# Patient Record
Sex: Female | Born: 2000 | Hispanic: Yes | Marital: Single | Smoking: Never smoker
Health system: Southern US, Community
[De-identification: ages and names within clinical notes are randomized; demographics above are authoritative.]

## PROBLEM LIST (undated history)

## (undated) ENCOUNTER — Emergency Department (HOSPITAL_COMMUNITY): Admission: EM | Payer: Self-pay | Source: Home / Self Care

## (undated) DIAGNOSIS — J45909 Unspecified asthma, uncomplicated: Secondary | ICD-10-CM

## (undated) DIAGNOSIS — J050 Acute obstructive laryngitis [croup]: Secondary | ICD-10-CM

## (undated) DIAGNOSIS — R109 Unspecified abdominal pain: Secondary | ICD-10-CM

## (undated) DIAGNOSIS — J0391 Acute recurrent tonsillitis, unspecified: Secondary | ICD-10-CM

## (undated) DIAGNOSIS — R519 Headache, unspecified: Secondary | ICD-10-CM

## (undated) DIAGNOSIS — R1110 Vomiting, unspecified: Secondary | ICD-10-CM

## (undated) DIAGNOSIS — N926 Irregular menstruation, unspecified: Secondary | ICD-10-CM

## (undated) DIAGNOSIS — R55 Syncope and collapse: Secondary | ICD-10-CM

## (undated) DIAGNOSIS — N390 Urinary tract infection, site not specified: Secondary | ICD-10-CM

## (undated) DIAGNOSIS — G43909 Migraine, unspecified, not intractable, without status migrainosus: Secondary | ICD-10-CM

## (undated) HISTORY — DX: Acute obstructive laryngitis (croup): J05.0

## (undated) HISTORY — DX: Irregular menstruation, unspecified: N92.6

## (undated) HISTORY — DX: Urinary tract infection, site not specified: N39.0

## (undated) HISTORY — DX: Vomiting, unspecified: R11.10

## (undated) HISTORY — DX: Syncope and collapse: R55

## (undated) HISTORY — DX: Migraine, unspecified, not intractable, without status migrainosus: G43.909

## (undated) HISTORY — DX: Unspecified abdominal pain: R10.9

## (undated) HISTORY — DX: Headache, unspecified: R51.9

## (undated) HISTORY — DX: Acute recurrent tonsillitis, unspecified: J03.91

---
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
## 2021-07-08 ENCOUNTER — Other Ambulatory Visit: Payer: Self-pay

## 2021-07-08 ENCOUNTER — Emergency Department (HOSPITAL_COMMUNITY)
Admission: EM | Admit: 2021-07-08 | Discharge: 2021-07-08 | Disposition: A | Discharge status: Home / Self Care | Payer: No Typology Code available for payment source | Attending: Emergency Medicine | Admitting: Emergency Medicine

## 2021-07-08 ENCOUNTER — Emergency Department (HOSPITAL_COMMUNITY): Payer: No Typology Code available for payment source

## 2021-07-08 DIAGNOSIS — S6992XA Unspecified injury of left wrist, hand and finger(s), initial encounter: Secondary | ICD-10-CM | POA: Diagnosis present

## 2021-07-08 DIAGNOSIS — W260XXA Contact with knife, initial encounter: Secondary | ICD-10-CM | POA: Insufficient documentation

## 2021-07-08 DIAGNOSIS — S61012A Laceration without foreign body of left thumb without damage to nail, initial encounter: Secondary | ICD-10-CM | POA: Diagnosis not present

## 2021-07-08 MED ORDER — LIDOCAINE HCL 2 % IJ SOLN
10.0000 mL | Freq: Once | INTRAMUSCULAR | Status: DC
  Filled 2021-07-08: qty 20

## 2021-07-08 NOTE — ED Triage Notes (Signed)
Pt w 1in lac to L thumb, oozing blood in triage. Last tdap approx 31yrs ago.

## 2021-07-08 NOTE — ED Provider Triage Note (Signed)
Emergency Medicine Provider Triage Evaluation Note  AICHA CLINGENPEEL , a 21 y.o. female  was evaluated in triage.  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  Last tetanus was 3 years ago.  Review of Systems  Positive:  Negative: See above   Physical Exam  BP 120/74    Pulse 79    Temp 98.6 F (37 C) (Oral)    Resp 16    SpO2 100%  Gen:   Awake, no distress   Resp:  Normal effort  MSK:   Moves extremities without difficulty  Other:    Medical Decision Making  Medically screening exam initiated at 8:51 PM.  Appropriate orders placed.  NYARA CAPELL was informed that the remainder of the evaluation will be completed by another provider, this initial triage assessment does not replace that evaluation, and the importance of remaining in the ED until their evaluation is complete.     Honor Loh [place], [place] 07/08/21 2054

## 2021-07-08 NOTE — ED Provider Notes (Signed)
[facility] EMERGENCY DEPARTMENT Provider Note   CSN: [id number] Arrival date & time: 07/08/21  2019     History  Chief Complaint  Patient presents with   Laceration    Victoria Oneal is a 21 y.o. female here presenting with left thumb laceration.  Patient states that she was opening a bag with a knife and accidentally cut the left thumb.  She states that it was bleeding so she wrapped it up with some gauze and came here.  Her tetanus is up-to-date.   The history is provided by the patient.      Home Medications Prior to Admission medications   Medication Sig Start Date End Date Taking? Authorizing Provider  ondansetron (ZOFRAN ODT) 4 MG disintegrating tablet Take 1 tablet (4 mg total) by mouth every 8 (eight) hours as needed. Patient not taking: No sig reported 12/29/18   Jacalyn Lefevre, MD      Allergies    Patient has no known allergies.    Review of Systems   Review of Systems  Skin:  Positive for wound.  All other systems reviewed and are negative.  Physical Exam Updated Vital Signs BP 120/74    Pulse 79    Temp 98.6 F (37 C) (Oral)    Resp 16    SpO2 100%  Physical Exam Vitals and nursing note reviewed.  Constitutional:      Appearance: Normal appearance.  HENT:     Head: Normocephalic.     Mouth/Throat:     Mouth: Mucous membranes are moist.  Eyes:     Pupils: Pupils are equal, round, and reactive to light.  Cardiovascular:     Rate and Rhythm: Normal rate.     Pulses: Normal pulses.  Pulmonary:     Effort: Pulmonary effort is normal.  Abdominal:     General: Abdomen is flat.  Musculoskeletal:     Cervical back: Normal range of motion.     Comments: Left thumb with 3 cm laceration on the volar aspect.  It approaches the nail but does not involve the nail.  Patient is able to flex the IP joint, there is no obvious tendon exposed.  Normal capillary refill  Neurological:     General: No focal deficit present.     Mental Status:  She is alert and oriented to person, place, and time.  Psychiatric:        Mood and Affect: Mood normal.        Behavior: Behavior normal.    ED Results / Procedures / Treatments   Labs (all labs ordered are listed, but only abnormal results are displayed) Labs Reviewed - No data to display  EKG None  Radiology DG Finger Thumb Left  Result Date: 07/08/2021 CLINICAL DATA:  Finger laceration EXAM: LEFT THUMB 2+V COMPARISON:  None. FINDINGS: No fracture or dislocation is seen. The joint spaces are preserved. Mild soft tissue swelling along the volar aspect of the 1st digit. No radiopaque foreign body is seen. IMPRESSION: Soft tissue swelling along the 1st digit. No fracture, dislocation, or radiopaque foreign body is seen. Electronically Signed   By: Charline Bills M.D.   On: 07/08/2021 21:13    Procedures .Marland KitchenLaceration Repair  Date/Time: 07/08/2021 9:29 PM Performed by: Charlynne Pander, MD Authorized by: Charlynne Pander, MD   Consent:    Consent obtained:  Verbal   Consent given by:  Patient   Risks discussed:  Infection, pain, retained foreign body, poor cosmetic result  and poor wound healing Anesthesia:    Anesthesia method:  Local infiltration   Local anesthetic:  Lidocaine 2% w/o epi Exploration:    Hemostasis achieved with:  Direct pressure   Contaminated: no   Treatment:    Area cleansed with:  Saline   Amount of cleaning:  Extensive   Irrigation solution:  Sterile saline   Visualized foreign bodies/material removed: no   Skin repair:    Repair method:  Sutures   Suture size:  5-0   Suture material:  Prolene   Suture technique:  Simple interrupted   Number of sutures:  3 Approximation:    Approximation:  Close Repair type:    Repair type:  Simple Post-procedure details:    Dressing:  Sterile dressing   Procedure completion:  Tolerated well, no immediate complications    Medications Ordered in ED Medications  lidocaine (XYLOCAINE) 2 % (with pres)  injection 200 mg (has no administration in time range)    ED Course/ Medical Decision Making/ A&P  MDM Number of Diagnoses or Management Options Diagnosis management comments: Victoria Oneal is a 21 y.o. female here presenting with left thumb laceration.  Patient's tetanus is up-to-date.  Will get x-ray to rule out fracture.  Will suture laceration  9:30 PM I reviewed x-ray and there was no fracture or dislocation.  I placed 3 sutures.  Suture removal in a week.  Stable for discharge      Amount and/or Complexity of Data Reviewed Tests in the radiology section of CPT: ordered and reviewed Independent visualization of images, tracings, or specimens: yes                            Final Clinical Impression(s) / ED Diagnoses Final diagnoses:  None    Rx / DC Orders ED Discharge Orders     None         Charlynne Pander, MD 07/08/21 2130

## 2021-07-08 NOTE — Discharge Instructions (Signed)
You had 3 sutures placed on the left thumb   Keep wound clean and dry. You may replace the bandage with bandaid tomorrow   Suture removal in a week   Return to ER if you have uncontrolled bleeding, severe pain, purulent discharge

## 2021-07-27 ENCOUNTER — Encounter (HOSPITAL_COMMUNITY): Payer: Self-pay | Admitting: Emergency Medicine

## 2021-07-27 ENCOUNTER — Emergency Department (HOSPITAL_COMMUNITY)
Admission: EM | Admit: 2021-07-27 | Discharge: 2021-07-28 | Disposition: A | Discharge status: Home / Self Care | Payer: Medicaid Other | Attending: Emergency Medicine | Admitting: Emergency Medicine

## 2021-07-27 ENCOUNTER — Other Ambulatory Visit: Payer: Self-pay

## 2021-07-27 DIAGNOSIS — Z5321 Procedure and treatment not carried out due to patient leaving prior to being seen by health care provider: Secondary | ICD-10-CM | POA: Insufficient documentation

## 2021-07-27 DIAGNOSIS — R1030 Lower abdominal pain, unspecified: Secondary | ICD-10-CM | POA: Insufficient documentation

## 2021-07-27 DIAGNOSIS — X58XXXA Exposure to other specified factors, initial encounter: Secondary | ICD-10-CM | POA: Diagnosis not present

## 2021-07-27 DIAGNOSIS — T192XXA Foreign body in vulva and vagina, initial encounter: Secondary | ICD-10-CM | POA: Diagnosis not present

## 2021-07-27 NOTE — ED Triage Notes (Signed)
Pt reported to [facility] with concerns of lost tampon and lower abominal pain. Pt states "I put a tampon in yesterday and it wasn't there when I went to take it out last night". Also reports pain to lower abdomen, denies any N/V/D.

## 2021-07-27 NOTE — ED Provider Triage Note (Signed)
Emergency Medicine Provider Triage Evaluation Note  Victoria Oneal , a 21 y.o. female  was evaluated in triage.  Pt complains of retained tampon.  States she started her menstrual cycle yesterday.  She put a tampon in.  Came home from work went to remove it and could not feel it.  States she tried to read.  However cannot feel anything.  She denies any pain, vaginal discharge.  Review of Systems  Positive: Retained foreign object Negative: Pelvic pain, vaginal discharge  Physical Exam  BP 129/80 (BP Location: Left Arm)    Pulse 77    Temp 98.6 F (37 C) (Oral)    Resp 18    SpO2 99%  Gen:   Awake, no distress   Resp:  Normal effort  MSK:   Moves extremities without difficulty  Other:    Medical Decision Making  Medically screening exam initiated at 8:09 PM.  Appropriate orders placed.  KAILIN PRINCIPATO was informed that the remainder of the evaluation will be completed by another provider, this initial triage assessment does not replace that evaluation, and the importance of remaining in the ED until their evaluation is complete.  Possible retained tampon   Erickson Yamashiro A, PA-C 07/27/21 2010

## 2021-07-28 NOTE — ED Notes (Signed)
Called for vitals x5 °

## 2022-02-04 IMAGING — CT CT CERVICAL SPINE W/O CM
3 of 4 series · 13 of 33 positions shown, 16 images · non-contrast
Comparison: None.

CLINICAL DATA: Syncopal episode. Head trauma. Loss of
consciousness.

EXAM:
CT HEAD WITHOUT CONTRAST
CT CERVICAL SPINE WITHOUT CONTRAST
TECHNIQUE: Multidetector CT imaging of the head and cervical spine was
performed following the standard protocol without intravenous
contrast. Multiplanar CT image reconstructions of the cervical spine
were also generated.

[Series 7: c_spine 2.0 cor bone · coronal · 0.24mm/px · 3 of 61 slices shown]
[im 14/61  bone]
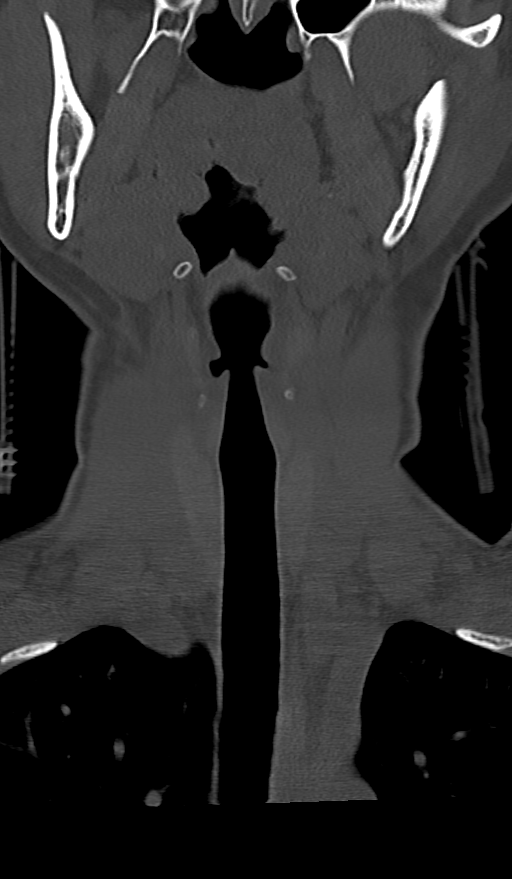
[im 25/61  bone]
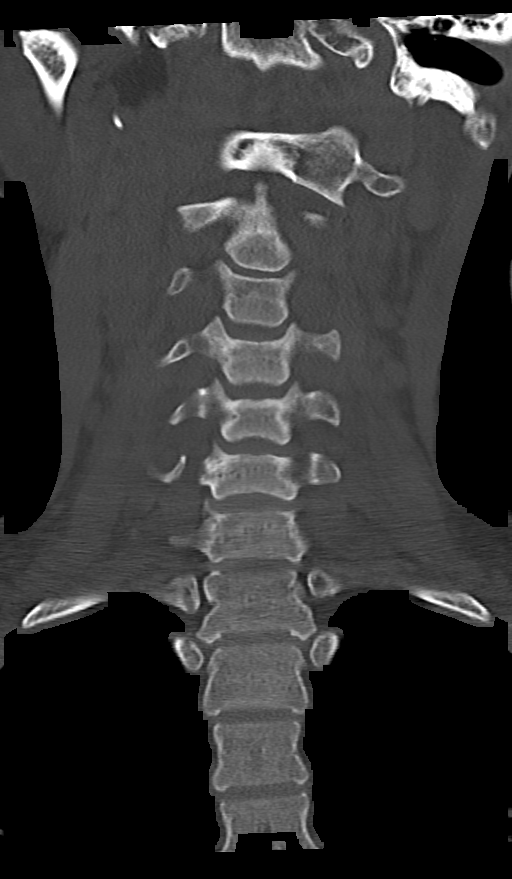
[im 36/61  bone]
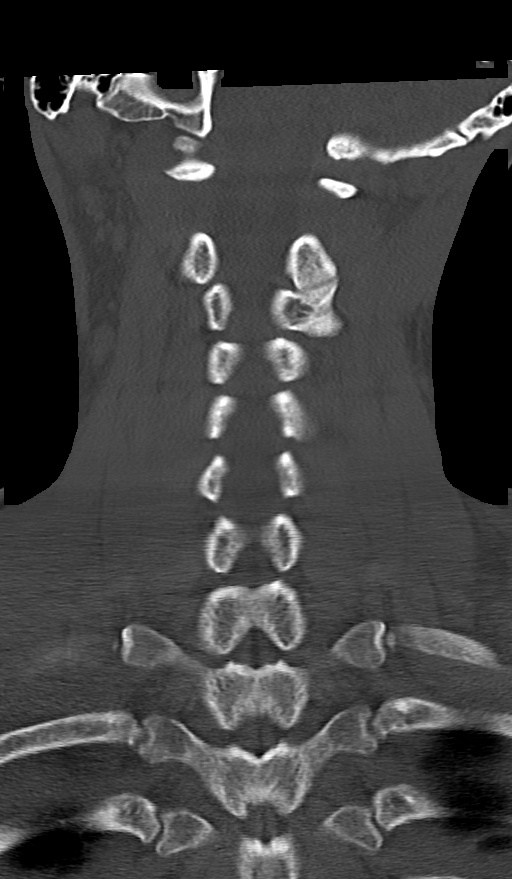

[Series 8: c_spine 2.0 sag bone · sagittal · 0.33mm/px · 5 of 61 slices shown, 6 images]
[im 21/61  bone]
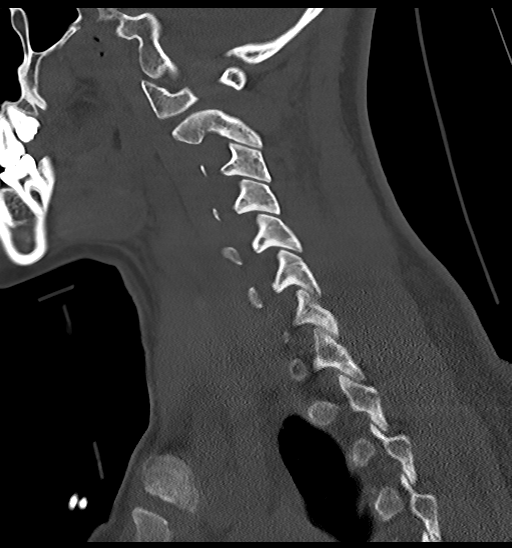
[im 26/61  bone]
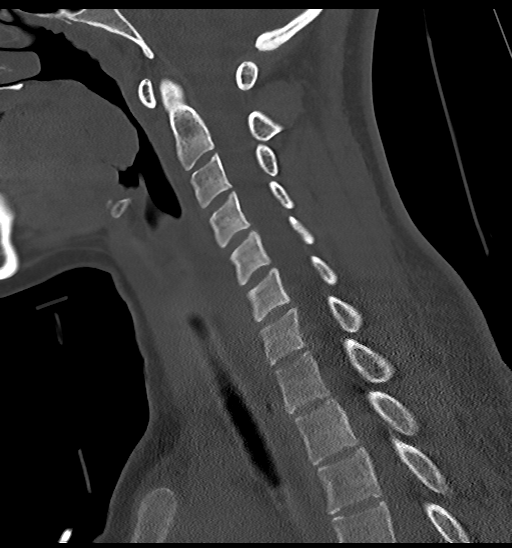
[im 31/61  soft-tissue]
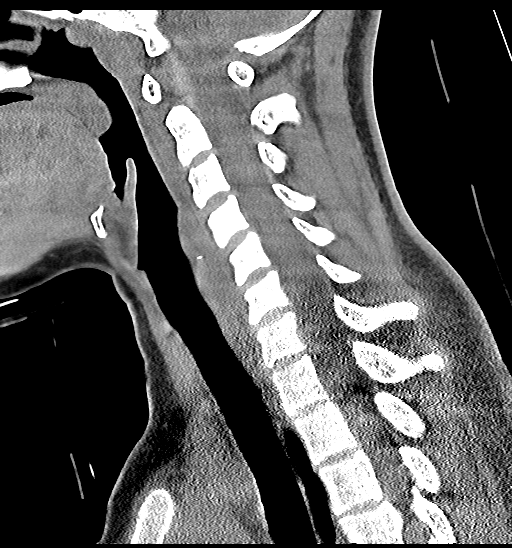
[im 31/61  bone]
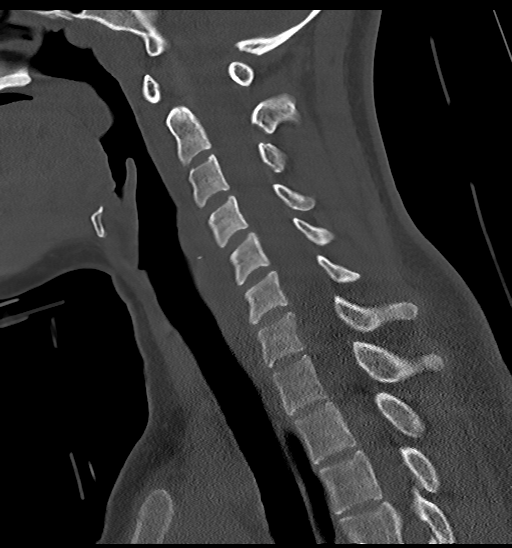
[im 36/61  bone]
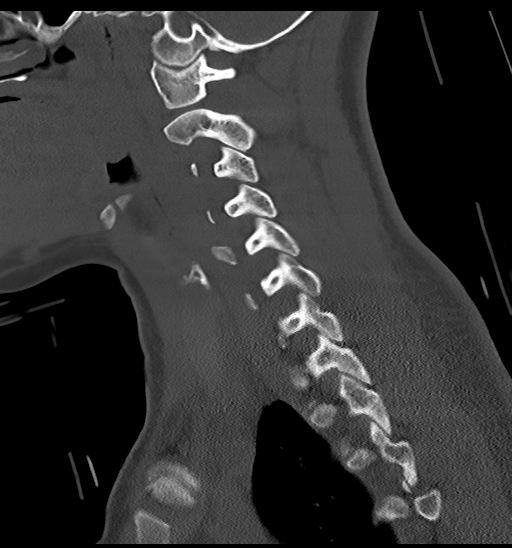
[im 41/61  bone]
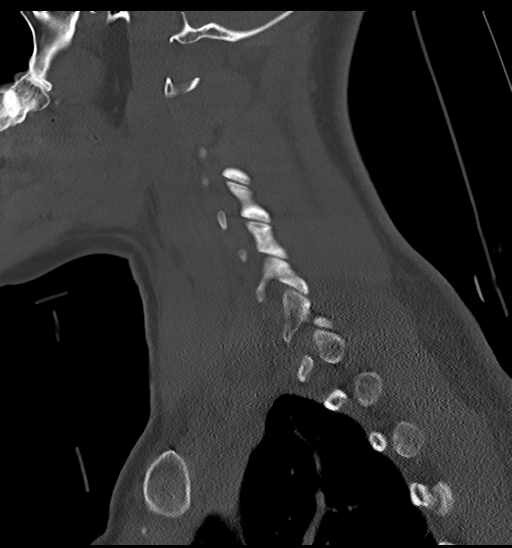

[Series 10: c_spine 2.0 orthogonals · axial · 0.21mm/px · z∈[-271,-146]mm · 5 of 98 slices shown, 7 images]
[im 17/98  soft-tissue]
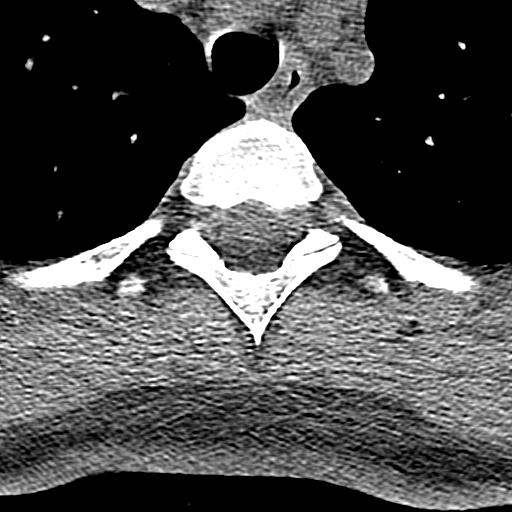
[im 17/98  bone]
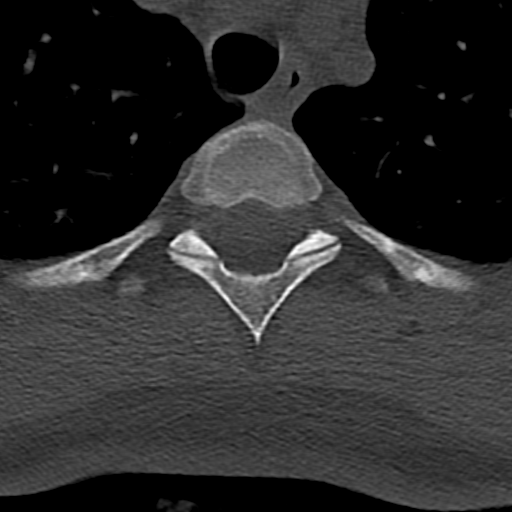
[im 33/98  bone]
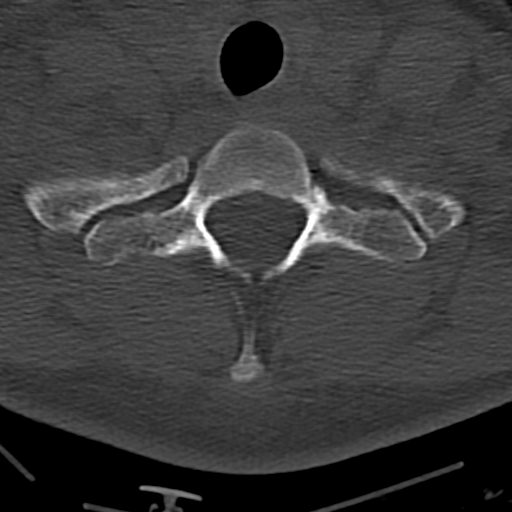
[im 49/98  bone]
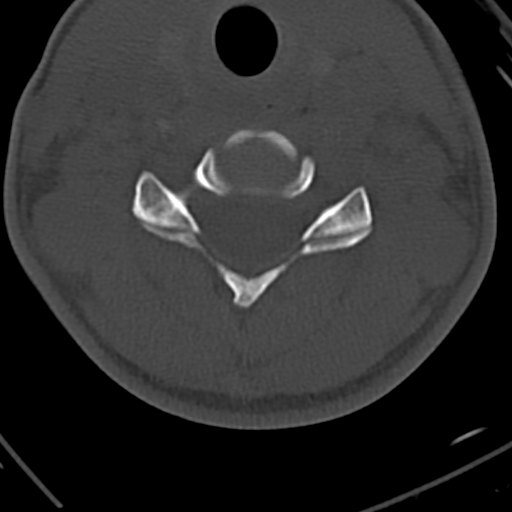
[im 65/98  bone]
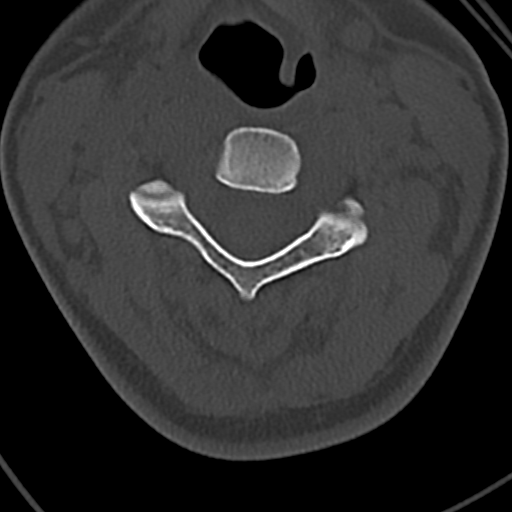
[im 81/98  soft-tissue]
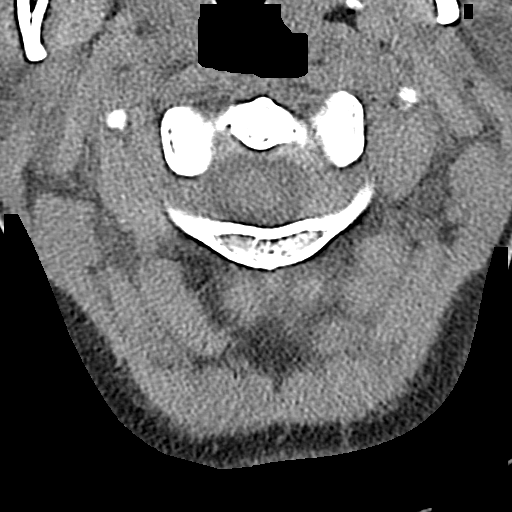
[im 81/98  bone]
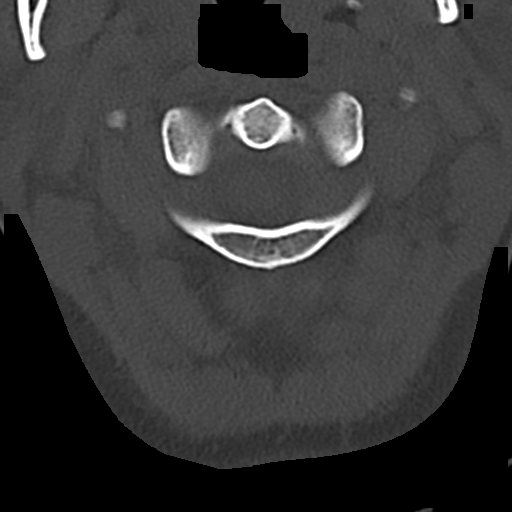

[13 of 33 positions shown; findings below may reference images not displayed]

FINDINGS: CT HEAD FINDINGS

Brain: The brain shows a normal appearance without evidence of
malformation, atrophy, old or acute small or large vessel
infarction, mass lesion, hemorrhage, hydrocephalus or extra-axial
collection.

Vascular: No hyperdense vessel. No evidence of atherosclerotic
calcification.

Skull: Normal.  No traumatic finding.  No focal bone lesion.

Sinuses/Orbits: Sinuses are clear. Orbits appear normal. Mastoids
are clear.

Other: None significant

CT CERVICAL SPINE FINDINGS

Alignment: Normal

Skull base and vertebrae: Normal

Soft tissues and spinal canal: Normal

Disc levels:  Normal

Upper chest: Normal

Other: None
IMPRESSION: HEAD CT:

Normal.

CERVICAL SPINE CT:

Normal.

## 2022-02-04 IMAGING — CT CT HEAD W/O CM
4 series · 16 of 47 positions shown, 18 images · non-contrast
Comparison: None.

CLINICAL DATA: Syncopal episode. Head trauma. Loss of
consciousness.

EXAM:
CT HEAD WITHOUT CONTRAST
CT CERVICAL SPINE WITHOUT CONTRAST
TECHNIQUE: Multidetector CT imaging of the head and cervical spine was
performed following the standard protocol without intravenous
contrast. Multiplanar CT image reconstructions of the cervical spine
were also generated.

[Series 3: head without · axial · non-contrast · 0.46mm/px · z∈[-95,+20]mm · 7 of 31 slices shown, 9 images]
[im 4/31  brain]
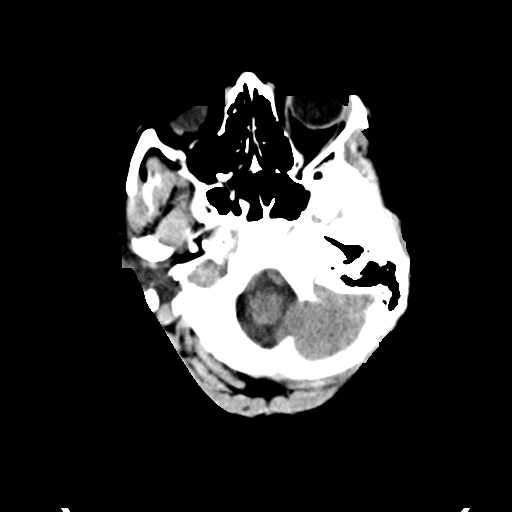
[im 4/31  bone]
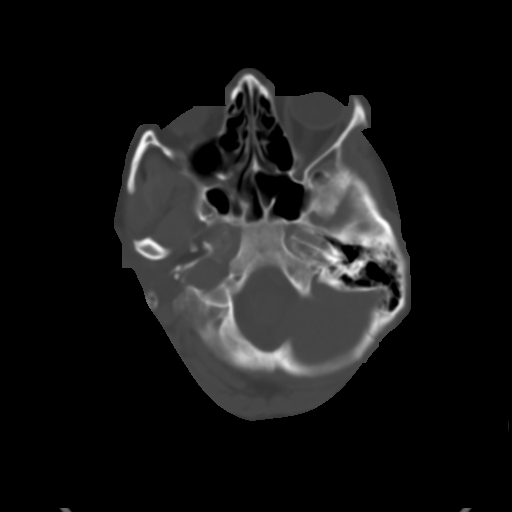
[im 8/31  brain]
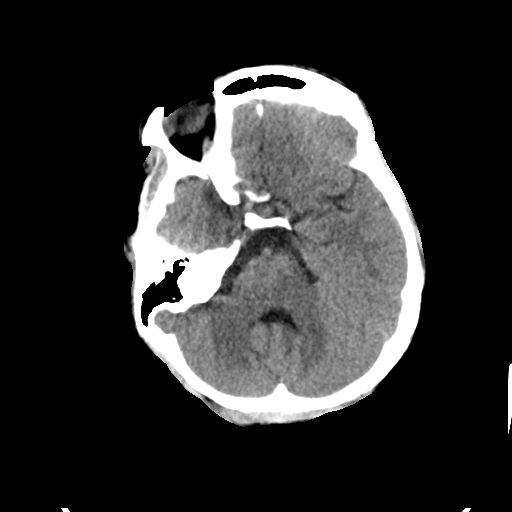
[im 12/31  brain]
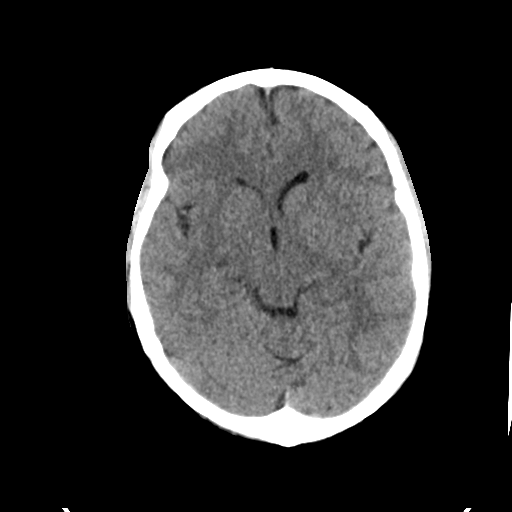
[im 16/31  brain]
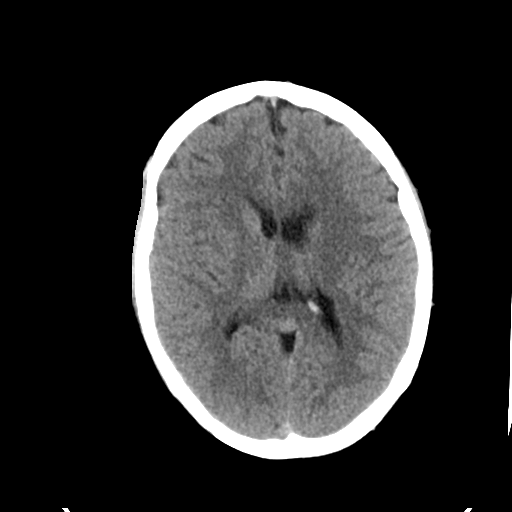
[im 19/31  brain]
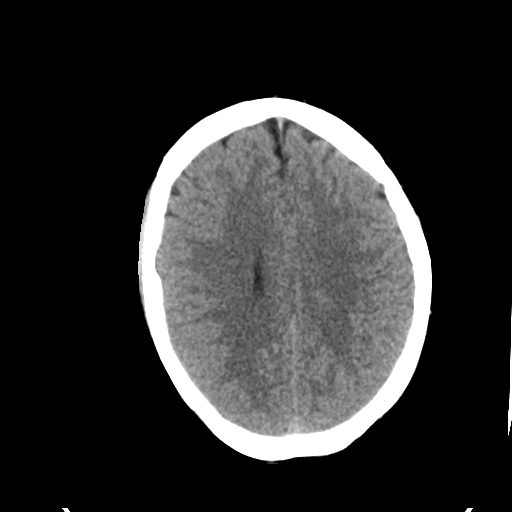
[im 19/31  bone]
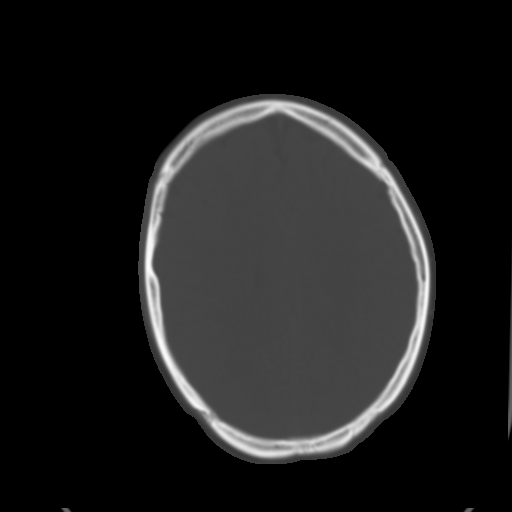
[im 23/31  brain]
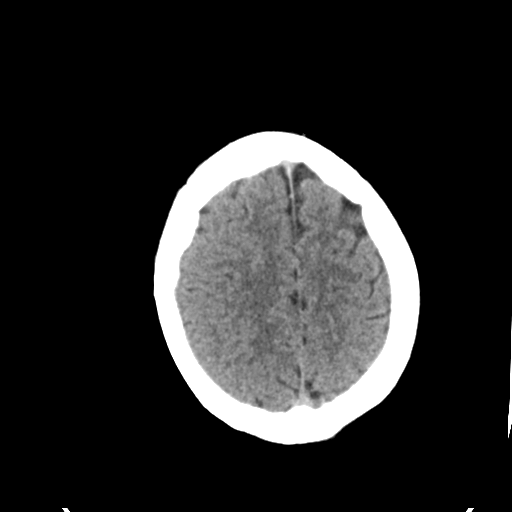
[im 27/31  brain]
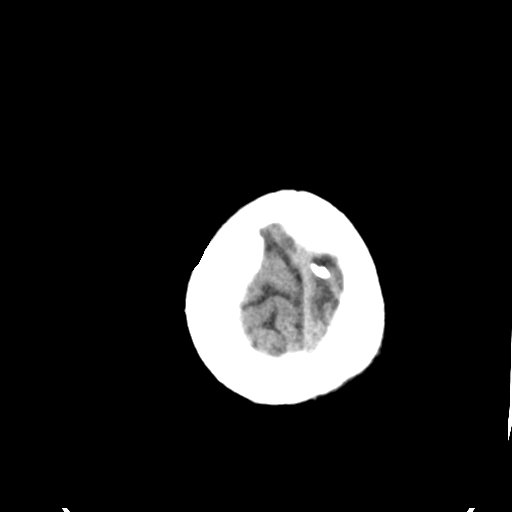

[Series 4: head bone · axial · 0.46mm/px · z∈[-96,-66]mm · 3 of 77 slices shown]
[im 8/77  bone]
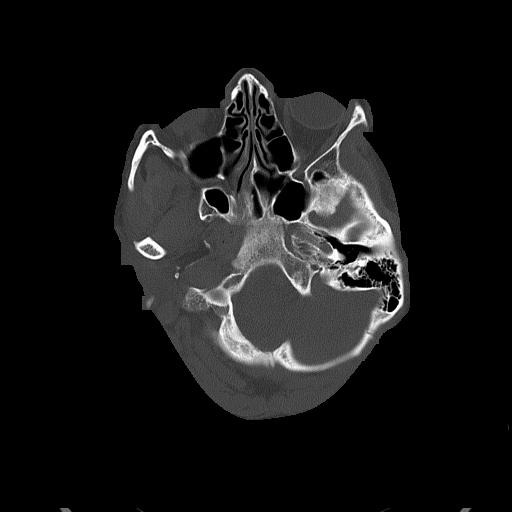
[im 16/77  bone]
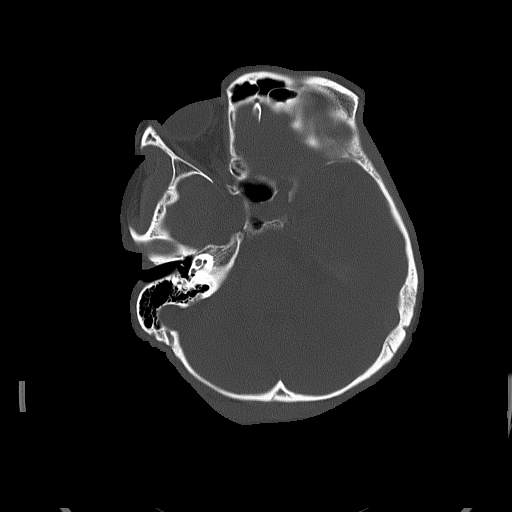
[im 23/77  bone]
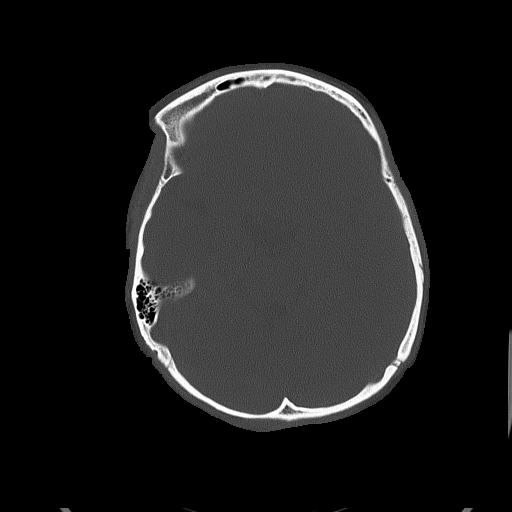

[Series 5: head without cor · coronal · non-contrast · 0.29mm/px · 3 of 71 slices shown]
[im 24/71  brain]
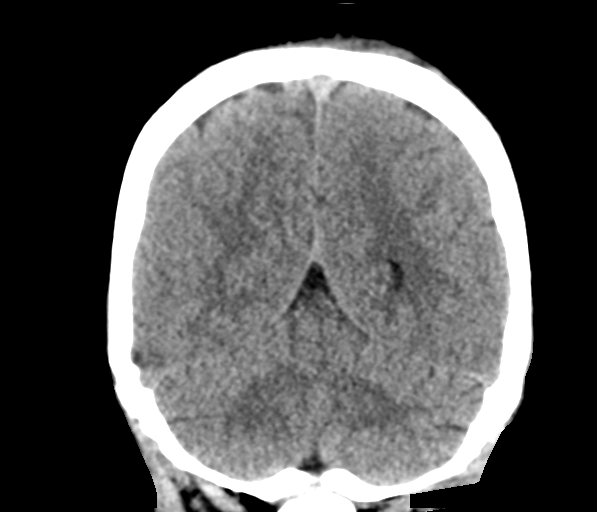
[im 32/71  brain]
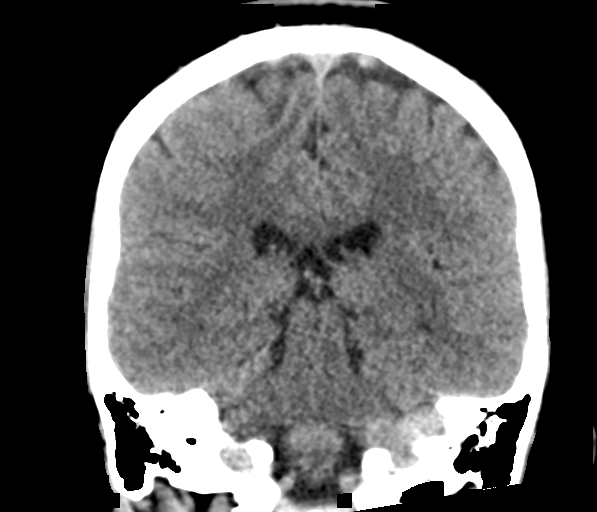
[im 39/71  brain]
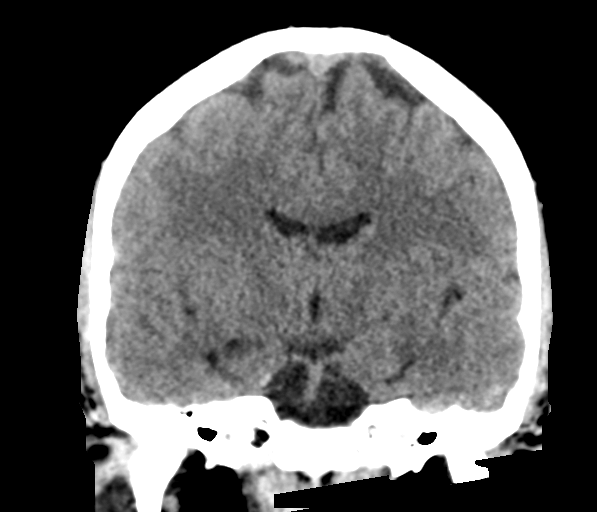

[Series 6: head without sag · sagittal · non-contrast · 0.32mm/px · 3 of 62 slices shown]
[im 23/62  brain]
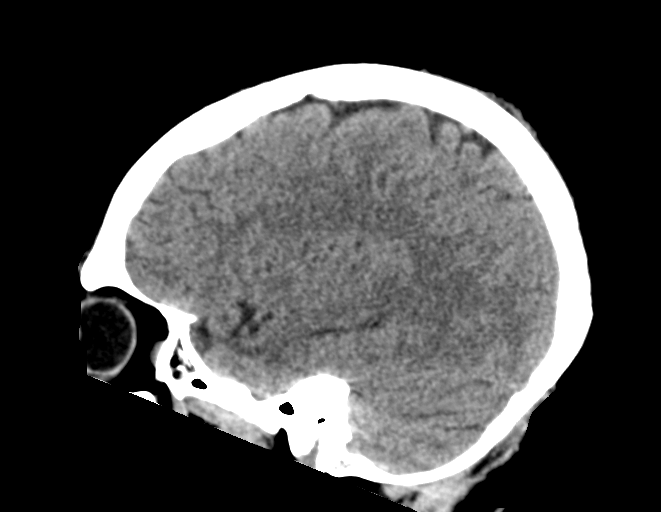
[im 31/62  brain]
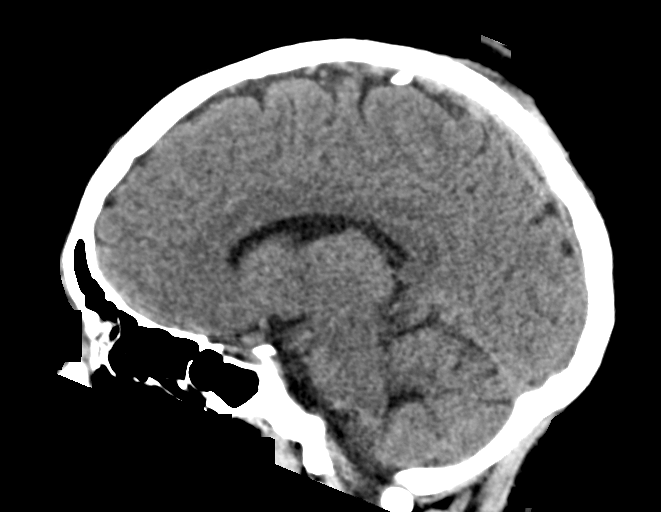
[im 40/62  brain]
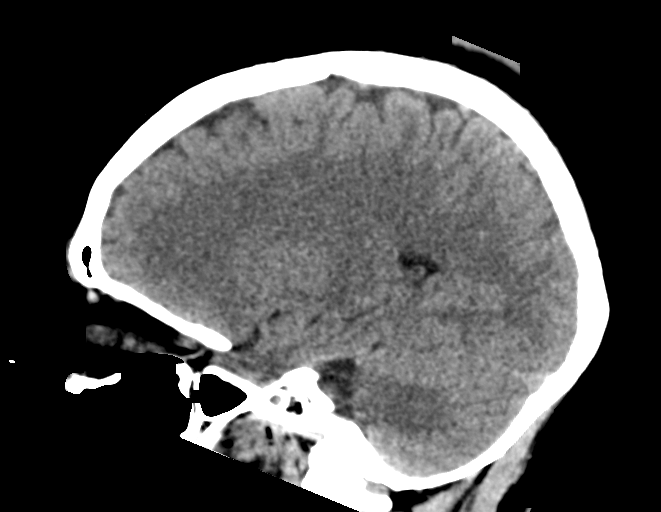

[16 of 47 positions shown; findings below may reference images not displayed]

FINDINGS: CT HEAD FINDINGS

Brain: The brain shows a normal appearance without evidence of
malformation, atrophy, old or acute small or large vessel
infarction, mass lesion, hemorrhage, hydrocephalus or extra-axial
collection.

Vascular: No hyperdense vessel. No evidence of atherosclerotic
calcification.

Skull: Normal.  No traumatic finding.  No focal bone lesion.

Sinuses/Orbits: Sinuses are clear. Orbits appear normal. Mastoids
are clear.

Other: None significant

CT CERVICAL SPINE FINDINGS

Alignment: Normal

Skull base and vertebrae: Normal

Soft tissues and spinal canal: Normal

Disc levels:  Normal

Upper chest: Normal

Other: None
IMPRESSION: HEAD CT:

Normal.

CERVICAL SPINE CT:

Normal.

## 2022-09-20 ENCOUNTER — Encounter (HOSPITAL_COMMUNITY): Payer: Self-pay

## 2022-09-20 ENCOUNTER — Other Ambulatory Visit: Payer: Self-pay

## 2022-09-20 ENCOUNTER — Emergency Department (HOSPITAL_COMMUNITY): Payer: Medicaid Other

## 2022-09-20 ENCOUNTER — Emergency Department (HOSPITAL_COMMUNITY)
Admission: EM | Admit: 2022-09-20 | Discharge: 2022-09-20 | Disposition: A | Discharge status: Home / Self Care | Payer: Medicaid Other | Attending: Emergency Medicine | Admitting: Emergency Medicine

## 2022-09-20 DIAGNOSIS — Y9302 Activity, running: Secondary | ICD-10-CM | POA: Insufficient documentation

## 2022-09-20 DIAGNOSIS — S6992XA Unspecified injury of left wrist, hand and finger(s), initial encounter: Secondary | ICD-10-CM | POA: Diagnosis present

## 2022-09-20 DIAGNOSIS — S62651A Nondisplaced fracture of medial phalanx of left index finger, initial encounter for closed fracture: Secondary | ICD-10-CM

## 2022-09-20 DIAGNOSIS — W2201XA Walked into wall, initial encounter: Secondary | ICD-10-CM | POA: Diagnosis not present

## 2022-09-20 MED ORDER — NAPROXEN 375 MG PO TABS: 375.0000 mg | ORAL_TABLET | Freq: Two times a day (BID) | ORAL | 0 refills | Status: AC

## 2022-09-20 NOTE — ED Triage Notes (Signed)
Pt states that she accidentally ran into a wall this morning. Pt has left pointer finger and hand swelling.

## 2022-09-20 NOTE — ED Provider Notes (Signed)
[place] [facility] AT [facility] Provider Note   CSN: [id number] Arrival date & time: 09/20/22  1510     History  Chief Complaint  Patient presents with   Finger Injury    Victoria Oneal is a 22 y.o. female.  22 y.o female with no PMH presents to the ED with a chief complaint pf left hand pain s/p injury. Patient reports she was running when suddenly she hit the wall, she reports severe pain to her left index finger exacerbated with palpation along with movement.  She has taken some ibuprofen to help with her pain without much improvement in symptoms.  There are no alleviating factors.  Denies any other injury.  The history is provided by the patient.       Home Medications Prior to Admission medications   Medication Sig Start Date End Date Taking? Authorizing Provider  naproxen (NAPROSYN) 375 MG tablet Take 1 tablet (375 mg total) by mouth 2 (two) times daily for 7 days. 09/20/22 09/27/22 Yes Eberardo Demello, PA-C  ondansetron (ZOFRAN ODT) 4 MG disintegrating tablet Take 1 tablet (4 mg total) by mouth every 8 (eight) hours as needed. Patient not taking: No sig reported 12/29/18   Jacalyn Lefevre, MD      Allergies    Patient has no known allergies.    Review of Systems   Review of Systems  Constitutional:  Negative for fever.  Musculoskeletal:  Positive for arthralgias.    Physical Exam Updated Vital Signs BP 105/71   Pulse 86   Temp 98.1 F (36.7 C) (Oral)   Resp 18   LMP 09/15/2022 (Exact Date)   SpO2 98%  Physical Exam Vitals and nursing note reviewed.  Constitutional:      Appearance: Normal appearance.  HENT:     Head: Normocephalic and atraumatic.     Nose: Nose normal.     Mouth/Throat:     Mouth: Mucous membranes are moist.  Cardiovascular:     Rate and Rhythm: Normal rate.  Pulmonary:     Effort: Pulmonary effort is normal.  Abdominal:     General: Abdomen is flat.  Musculoskeletal:     Left hand: Swelling and  tenderness present. No deformity or bony tenderness. Normal sensation. There is no disruption of two-point discrimination. Normal capillary refill. Normal pulse.     Cervical back: Normal range of motion and neck supple.     Comments: Pain with palpation along the PIP, has good flexion and extension.  Radial pulses 2+.  There is a ring attached to the middle finger, we did remove this on evaluation his left hand was more swollen.  Skin:    General: Skin is warm and dry.  Neurological:     Mental Status: She is alert and oriented to person, place, and time.     ED Results / Procedures / Treatments   Labs (all labs ordered are listed, but only abnormal results are displayed) Labs Reviewed - No data to display  EKG None  Radiology DG Hand Complete Left  Result Date: 09/20/2022 CLINICAL DATA:  Injury, pain and swelling EXAM: LEFT HAND - COMPLETE 3+ VIEW COMPARISON:  07/08/2021 FINDINGS: Left second digit soft tissue swelling noted. Suspect small volar plate avulsion type fracture of the left second digit middle phalanx at the PIP joint on the oblique view. Correlate with point tenderness in this region. This is not well visualized on the lateral view because of overlap. No other joint abnormality or arthropathy.  IMPRESSION: Suspect small volar plate avulsion type fracture of the left second digit middle phalanx at the PIP joint. Electronically Signed   By: Judie Petit.  Shick M.D.   On: 09/20/2022 15:40    Procedures Procedures    Medications Ordered in ED Medications - No data to display  ED Course/ Medical Decision Making/ A&P                             Medical Decision Making Amount and/or Complexity of Data Reviewed Radiology: ordered.    Patient presents to the ED with a chief plaint of left hand injury this morning.  Reports she was working outside when she struck a wall, she reports pain to the left index finger, exacerbated with movement, palpation, did take some over-the-counter  medication without improvement symptoms.  On evaluation she is neurovascularly intact, has decreased range of motion due to pain, no signs of skin tenting, no signs of open fracture noted.  X-ray does show a small avulsion fracture, we discussed abortive management with finger splint, will go home with a short course of NSAIDs along with follow-up with orthopedics.  She is agreeable to plan and treatment, patient is hemodynamically stable for discharge.   Portions of this note were generated with Scientist, clinical (histocompatibility and immunogenetics). Dictation errors may occur despite best attempts at proofreading.   Final Clinical Impression(s) / ED Diagnoses Final diagnoses:  Nondisplaced fracture of middle phalanx of left index finger, initial encounter for closed fracture    Rx / DC Orders ED Discharge Orders          Ordered    naproxen (NAPROSYN) 375 MG tablet  2 times daily        09/20/22 1600              Claude Manges, PA-C 09/20/22 1607    Glynn Octave, MD 09/20/22 1629

## 2022-09-20 NOTE — Discharge Instructions (Addendum)
I have prescribed a short course of anti-inflammatories in order to help with your pain.  You were placed on a finger splint in order to help with comfort, you may discontinue this after a few days.  If you would like to follow-up with a hand specialist, please schedule an appointment with Dr. Everardo Pacific
